# Patient Record
Sex: Male | Born: 1980 | Race: Black or African American | Hispanic: No | Marital: Married | Smoking: Never smoker
Health system: Southern US, Community
[De-identification: ages and names within clinical notes are randomized; demographics above are authoritative.]

---

## 2011-02-15 ENCOUNTER — Emergency Department (HOSPITAL_BASED_OUTPATIENT_CLINIC_OR_DEPARTMENT_OTHER)
Admission: EM | Admit: 2011-02-15 | Discharge: 2011-02-16 | Disposition: A | Discharge status: Home / Self Care | Payer: BC Managed Care – PPO | Attending: Emergency Medicine | Admitting: Emergency Medicine

## 2011-02-15 ENCOUNTER — Emergency Department (INDEPENDENT_AMBULATORY_CARE_PROVIDER_SITE_OTHER): Payer: BC Managed Care – PPO

## 2011-02-15 ENCOUNTER — Other Ambulatory Visit: Payer: Self-pay

## 2011-02-15 ENCOUNTER — Emergency Department (HOSPITAL_BASED_OUTPATIENT_CLINIC_OR_DEPARTMENT_OTHER): Payer: BC Managed Care – PPO

## 2011-02-15 DIAGNOSIS — R079 Chest pain, unspecified: Secondary | ICD-10-CM

## 2011-02-15 LAB — CBC
HCT: 39.8 % (ref 39.0–52.0)
Hemoglobin: 13.3 g/dL (ref 13.0–17.0)
MCHC: 33.4 g/dL (ref 30.0–36.0)
MCV: 88.2 fL (ref 78.0–100.0)
RDW: 12.4 % (ref 11.5–15.5)
WBC: 7.2 10*3/uL (ref 4.0–10.5)

## 2011-02-15 LAB — DIFFERENTIAL
Basophils Absolute: 0 10*3/uL (ref 0.0–0.1)
Eosinophils Relative: 1 % (ref 0–5)
Lymphocytes Relative: 30 % (ref 12–46)
Monocytes Absolute: 0.6 10*3/uL (ref 0.1–1.0)
Monocytes Relative: 9 % (ref 3–12)
Neutro Abs: 4.3 10*3/uL (ref 1.7–7.7)

## 2011-02-15 LAB — COMPREHENSIVE METABOLIC PANEL
BUN: 10 mg/dL (ref 6–23)
CO2: 28 mEq/L (ref 19–32)
Calcium: 10.1 mg/dL (ref 8.4–10.5)
Chloride: 106 mEq/L (ref 96–112)
Creatinine, Ser: 1 mg/dL (ref 0.50–1.35)
GFR calc Af Amer: >90 mL/min (ref >90)
GFR calc non Af Amer: >90 mL/min (ref >90)
Total Bilirubin: 0.3 mg/dL (ref 0.3–1.2)

## 2011-02-15 LAB — TROPONIN I: Troponin I: <0.3 ng/mL (ref <0.30)

## 2011-02-15 NOTE — ED Notes (Signed)
C/p CP,n,sob,numbness to arms-states cont'd c/o less intense

## 2011-02-16 NOTE — ED Provider Notes (Signed)
History     CSN: [id number] Arrival date & time: 02/15/2011 11:00 PM  Chief Complaint  Patient presents with  . Chest Pain     HPI Gerald Estrada is a 30 y.o.male who presents today complaining of chest pain.He reports that the pain associated with this is a 6 out of 10.  The pain is in the  middle of his chest.  It radiates to left arm.  It is made better by nothing in particular that he can think of no is feeling better at the moment. It is made worse by nothing.  Patient first noticed this 2 days ago. He cannot recall what he was doing at that time. Pain started on the right side of his chest. He denies any associated cough or fevers. Patient has no significant past medical history. He is no family history of early coronary artery disease. He did not have any variation in his pain with deep inspiration. He did not have any family history of DVT or PE. The patient came today as he was concerned that he could be having a heart attack. He said that his pain is currently only a 2/10. He has not taken anything for this and it has been constant but improving. There are no other associated or modifying factors.   History reviewed. No pertinent past medical history.  History reviewed. No pertinent past surgical history.  Family History  Problem Relation Age of Onset  . Diabetes Father     History  Substance Use Topics  . Smoking status: Never Smoker   . Smokeless tobacco: Not on file  . Alcohol Use: Yes      Review of Systems  Constitutional: Negative.   HENT: Negative.   Eyes: Negative.   Respiratory: Negative.   Cardiovascular: Positive for chest pain.  Gastrointestinal: Negative.   Genitourinary: Negative.   Musculoskeletal: Negative.   Skin: Negative.   Neurological: Negative.   Hematological: Negative.   Psychiatric/Behavioral: Negative.   All other systems reviewed and are negative.    Allergies  Review of patient's allergies indicates no known allergies.  Home  Medications  No current outpatient prescriptions on file.  BP 130/70  Pulse 79  Temp(Src) 98.8 F (37.1 C) (Oral)  Resp 16  Ht 6\' 2"  (1.88 m)  Wt 171 lb (77.565 kg)  BMI 21.96 kg/m2  Physical Exam  Nursing note and vitals reviewed. Constitutional: He is oriented to person, place, and time. He appears well-developed and well-nourished. No distress.  HENT:  Head: Normocephalic and atraumatic.  Eyes: Conjunctivae and EOM are normal. Pupils are equal, round, and reactive to light.  Neck: Normal range of motion.  Cardiovascular: Normal rate, regular rhythm, normal heart sounds and intact distal pulses.  Exam reveals no gallop and no friction rub.   No murmur heard. Pulmonary/Chest: Effort normal and breath sounds normal. No respiratory distress. He has no wheezes. He has no rales.  Abdominal: Soft. Bowel sounds are normal. He exhibits no distension. There is no tenderness. There is no rebound and no guarding.  Musculoskeletal: Normal range of motion.  Neurological: He is alert and oriented to person, place, and time. No cranial nerve deficit. He exhibits normal muscle tone. Coordination normal.  Skin: Skin is warm and dry. No rash noted.  Psychiatric: He has a normal mood and affect.    ED Course  Procedures    Date: 02/15/2011  Rate: 78  Rhythm: normal sinus rhythm  QRS Axis: normal  Intervals: normal  ST/T Wave  abnormalities: normal  Conduction Disutrbances:none  Narrative Interpretation:   Old EKG Reviewed: none available   Labs Reviewed  CBC  DIFFERENTIAL  COMPREHENSIVE METABOLIC PANEL  TROPONIN I   Dg Chest 2 View  02/15/2011  *RADIOLOGY REPORT*  Clinical Data: Chest pain  CHEST - 2 VIEW  Comparison: None  Findings: Heart size is normal.  Mediastinal shadows are normal. Lungs are clear.  No effusions.  No acute bony abnormality.  Pectus deformity.  IMPRESSION: Normal chest.  Pectus deformity.  Original Report Authenticated By: Thomasenia Sales, M.D.     1. Chest  pain, unspecified       MDM  Patient was evaluated by myself and was hemodynamically stable. His history was very atypical for possible acute coronary syndrome. He was also atypical for a possible pulmonary embolus or pneumonia. Patient had arrived prior to the beginning of my shift. Workup including a CBC, EKG, CBC, basic metabolic panel, and troponin have been ordered prior to my arrival. EKG was reviewed and was unremarkable. Remainder of laboratory workup was also within normal limits. Patient had complete resolution of his chest pain without intervention. We discussed reasons why the patient should return. He was also provided with reassurance. Patient was advised that as he did not have her primary care provider that he should consider obtaining one if this occurred again. Patient was discharged home in good condition.        Cyndra Numbers, MD 02/16/11 [phone]

## 2015-07-20 ENCOUNTER — Encounter (HOSPITAL_COMMUNITY): Payer: Self-pay | Admitting: Emergency Medicine

## 2015-07-20 ENCOUNTER — Emergency Department (HOSPITAL_COMMUNITY)
Admission: EM | Admit: 2015-07-20 | Discharge: 2015-07-20 | Disposition: A | Discharge status: Home / Self Care | Payer: No Typology Code available for payment source | Attending: Emergency Medicine | Admitting: Emergency Medicine

## 2015-07-20 ENCOUNTER — Emergency Department (HOSPITAL_COMMUNITY): Payer: No Typology Code available for payment source

## 2015-07-20 DIAGNOSIS — R52 Pain, unspecified: Secondary | ICD-10-CM

## 2015-07-20 DIAGNOSIS — Y92410 Unspecified street and highway as the place of occurrence of the external cause: Secondary | ICD-10-CM | POA: Diagnosis not present

## 2015-07-20 DIAGNOSIS — S29002A Unspecified injury of muscle and tendon of back wall of thorax, initial encounter: Secondary | ICD-10-CM | POA: Diagnosis not present

## 2015-07-20 DIAGNOSIS — Y998 Other external cause status: Secondary | ICD-10-CM | POA: Diagnosis not present

## 2015-07-20 DIAGNOSIS — Y9389 Activity, other specified: Secondary | ICD-10-CM | POA: Insufficient documentation

## 2015-07-20 DIAGNOSIS — S199XXA Unspecified injury of neck, initial encounter: Secondary | ICD-10-CM | POA: Diagnosis not present

## 2015-07-20 DIAGNOSIS — M542 Cervicalgia: Secondary | ICD-10-CM

## 2015-07-20 MED ORDER — IBUPROFEN 200 MG PO TABS
400.0000 mg | ORAL_TABLET | Freq: Once | ORAL | Status: AC
  Administered 2015-07-20: 400 mg via ORAL
  Filled 2015-07-20: qty 2

## 2015-07-20 MED ORDER — CYCLOBENZAPRINE HCL 10 MG PO TABS
10.0000 mg | ORAL_TABLET | Freq: Once | ORAL | Status: AC
  Administered 2015-07-20: 10 mg via ORAL
  Filled 2015-07-20: qty 1

## 2015-07-20 MED ORDER — CYCLOBENZAPRINE HCL 10 MG PO TABS: 10.0000 mg | ORAL_TABLET | Freq: Three times a day (TID) | ORAL | Status: DC | PRN

## 2015-07-20 MED ORDER — IBUPROFEN 400 MG PO TABS: 400.0000 mg | ORAL_TABLET | Freq: Three times a day (TID) | ORAL | Status: AC
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
## 2015-07-20 NOTE — ED Notes (Signed)
MD at bedside. 

## 2015-07-20 NOTE — ED Notes (Signed)
Patient transported to X-ray 

## 2015-07-22 ENCOUNTER — Encounter (HOSPITAL_COMMUNITY): Payer: Self-pay | Admitting: Emergency Medicine

## 2015-07-22 ENCOUNTER — Emergency Department (HOSPITAL_COMMUNITY)
Admission: EM | Admit: 2015-07-22 | Discharge: 2015-07-22 | Disposition: A | Discharge status: Home / Self Care | Payer: No Typology Code available for payment source | Attending: Emergency Medicine | Admitting: Emergency Medicine

## 2015-07-22 DIAGNOSIS — R51 Headache: Secondary | ICD-10-CM | POA: Diagnosis not present

## 2015-07-22 DIAGNOSIS — S134XXA Sprain of ligaments of cervical spine, initial encounter: Secondary | ICD-10-CM | POA: Diagnosis not present

## 2015-07-22 DIAGNOSIS — Y92410 Unspecified street and highway as the place of occurrence of the external cause: Secondary | ICD-10-CM | POA: Insufficient documentation

## 2015-07-22 DIAGNOSIS — S139XXA Sprain of joints and ligaments of unspecified parts of neck, initial encounter: Secondary | ICD-10-CM

## 2015-07-22 DIAGNOSIS — R519 Headache, unspecified: Secondary | ICD-10-CM

## 2015-07-22 DIAGNOSIS — Y9389 Activity, other specified: Secondary | ICD-10-CM | POA: Insufficient documentation

## 2015-07-22 DIAGNOSIS — Y998 Other external cause status: Secondary | ICD-10-CM | POA: Insufficient documentation

## 2015-07-22 DIAGNOSIS — S199XXA Unspecified injury of neck, initial encounter: Secondary | ICD-10-CM | POA: Diagnosis present

## 2015-07-22 NOTE — ED Notes (Signed)
Spoke with Lemont Fillersatyana, PA-C r/t work note.  Per Lemont Fillersatyana, may write note to restrict lifting >5# until Monday.
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
## 2018-01-28 ENCOUNTER — Encounter (HOSPITAL_COMMUNITY): Payer: Self-pay

## 2018-01-28 ENCOUNTER — Ambulatory Visit (HOSPITAL_COMMUNITY)
Admission: EM | Admit: 2018-01-28 | Discharge: 2018-01-28 | Disposition: A | Discharge status: Home / Self Care | Payer: 59 | Attending: Family Medicine | Admitting: Family Medicine

## 2018-01-28 ENCOUNTER — Ambulatory Visit (INDEPENDENT_AMBULATORY_CARE_PROVIDER_SITE_OTHER): Payer: 59

## 2018-01-28 DIAGNOSIS — S93401A Sprain of unspecified ligament of right ankle, initial encounter: Secondary | ICD-10-CM

## 2018-01-28 MED ORDER — IBUPROFEN 800 MG PO TABS: 800.0000 mg | ORAL_TABLET | Freq: Three times a day (TID) | ORAL | 0 refills | Status: DC

## 2018-01-28 NOTE — Discharge Instructions (Addendum)
Xray is normal here today, consistent with a sprain.  Ace wrap for compression and support, especially with activity.  Ice, elevation, ibuprofen for pain control.  Activity as tolerated.  See exercises provided. Please follow up with orthopedics if persistent pain greater than 4 weeks.

## 2018-01-28 NOTE — ED Provider Notes (Signed)
[facility]    CSN: [id number] Arrival date & time: 01/28/18  5621     History   Chief Complaint Chief Complaint  Patient presents with  . Ankle Pain    HPI Gerald Estrada is a 37 y.o. male.   Divon presents with complaints of right ankle pain after he miss-stepped off a step two nights ago and twisted it. Has not been able to walk since then as increased pain with weight bearing. Pain 7/10 with weight bearing. Has elevated it, no ice application. Has not taken any medications for his pain. States he sprained it once years ago but no other previous injury or surgery. No numbness or tingling. Without contributing medical history.     ROS per HPI.      History reviewed. No pertinent past medical history.  There are no active problems to display for this patient.   History reviewed. No pertinent surgical history.     Home Medications    Prior to Admission medications   Medication Sig Start Date End Date Taking? Authorizing Provider  cyclobenzaprine (FLEXERIL) 10 MG tablet Take 1 tablet (10 mg total) by mouth 3 (three) times daily as needed for muscle spasms. 07/20/15   Mesner, Barbara Cower, MD  ibuprofen (ADVIL,MOTRIN) 800 MG tablet Take 1 tablet (800 mg total) by mouth 3 (three) times daily. 01/28/18   Georgetta Haber, NP    Family History Family History  Problem Relation Age of Onset  . Diabetes Father   . Hypertension Other     Social History Social History   Tobacco Use  . Smoking status: Never Smoker  Substance Use Topics  . Alcohol use: No  . Drug use: No     Allergies   Patient has no known allergies.   Review of Systems Review of Systems   Physical Exam Triage Vital Signs ED Triage Vitals [01/28/18 0911]  Enc Vitals Group     BP 119/75     Pulse Rate 76     Resp 20     Temp 97.8 F (36.6 C)     Temp Source Oral     SpO2 99 %     Weight      Height      Head Circumference      Peak Flow      Pain Score      Pain Loc      Pain Edu?      Excl. in GC?    No data found.  Updated Vital Signs BP 119/75 (BP Location: Right Arm)   Pulse 76   Temp 97.8 F (36.6 C) (Oral)   Resp 20   SpO2 99%   Visual Acuity Right Eye Distance:   Left Eye Distance:   Bilateral Distance:    Right Eye Near:   Left Eye Near:    Bilateral Near:     Physical Exam  Constitutional: He is oriented to person, place, and time. He appears well-developed and well-nourished.  Cardiovascular: Normal rate and regular rhythm.  Pulmonary/Chest: Effort normal and breath sounds normal.  Musculoskeletal:       Right ankle: He exhibits decreased range of motion and swelling. He exhibits no ecchymosis, no deformity, no laceration and normal pulse. Tenderness. Lateral malleolus tenderness found. Achilles tendon normal.       Feet:  Right lateral ankle pain on palpation, mild dorsal ankle pain as well as mild medial ankle pain on palpation; pain with eversion and inversion noted as well  as some pain with dorsiflexion; toe ROm intact, cap refill < 2 seconds ; strong pedal pulse; swelling, primarily to lateral ankle; no redness   Neurological: He is alert and oriented to person, place, and time.  Skin: Skin is warm and dry.     UC Treatments / Results  Labs (all labs ordered are listed, but only abnormal results are displayed) Labs Reviewed - No data to display  EKG None  Radiology Dg Ankle Complete Right  Result Date: 01/28/2018 CLINICAL DATA:  Right ankle pain after injury 2 days ago. EXAM: RIGHT ANKLE - COMPLETE 3+ VIEW COMPARISON:  None. FINDINGS: There is no evidence of fracture, dislocation, or joint effusion. There is no evidence of arthropathy or other focal bone abnormality. Soft tissues are unremarkable. IMPRESSION: Normal right ankle. Electronically Signed   By: Lupita RaiderJames  Green Jr, M.D.   On: 01/28/2018 10:30    Procedures Procedures (including critical care time)  Medications Ordered in UC Medications - No data to  display  Initial Impression / Assessment and Plan / UC Course  I have reviewed the triage vital signs and the nursing notes.  Pertinent labs & imaging results that were available during my care of the patient were reviewed by me and considered in my medical decision making (see chart for details).     Negative xray today. History, physical and imaging consistent with sprain. Ice, elevation, ace wrap, crutches to help with pain. Activity as tolerated. Follow up with sports medicine as needed. Patient verbalized understanding and agreeable to plan.   Final Clinical Impressions(s) / UC Diagnoses   Final diagnoses:  Sprain of right ankle, unspecified ligament, initial encounter     Discharge Instructions     Xray is normal here today, consistent with a sprain.  Ace wrap for compression and support, especially with activity.  Ice, elevation, ibuprofen for pain control.  Activity as tolerated.  See exercises provided. Please follow up with orthopedics if persistent pain greater than 4 weeks.     ED Prescriptions    Medication Sig Dispense Auth. Provider   ibuprofen (ADVIL,MOTRIN) 800 MG tablet Take 1 tablet (800 mg total) by mouth 3 (three) times daily. 21 tablet Georgetta HaberBurky, Natalie B, NP     Controlled Substance Prescriptions [place] Controlled Substance Registry consulted? Not Applicable   Georgetta HaberBurky, Natalie B, NP 01/28/18 1041

## 2018-01-28 NOTE — ED Triage Notes (Signed)
Pt presents with right ankle pain after twisting it 2 nights ago while walking.

## 2018-01-30 ENCOUNTER — Ambulatory Visit (HOSPITAL_COMMUNITY)
Admission: EM | Admit: 2018-01-30 | Discharge: 2018-01-30 | Disposition: A | Discharge status: Home / Self Care | Payer: 59

## 2018-01-30 NOTE — ED Notes (Signed)
Pt requesting info for follow up for ankle sprain, given ortho urgent care hours and information. PT decided to go there tonight.

## 2018-02-06 ENCOUNTER — Ambulatory Visit: Payer: 59 | Admitting: Sports Medicine

## 2019-01-06 IMAGING — DX DG ANKLE COMPLETE 3+V*R*
3 series · 3 of 3 positions shown · non-contrast
Comparison: None.

CLINICAL DATA: Right ankle pain after injury 2 days ago.

EXAM:
RIGHT ANKLE - COMPLETE 3+ VIEW

[ankle ap]
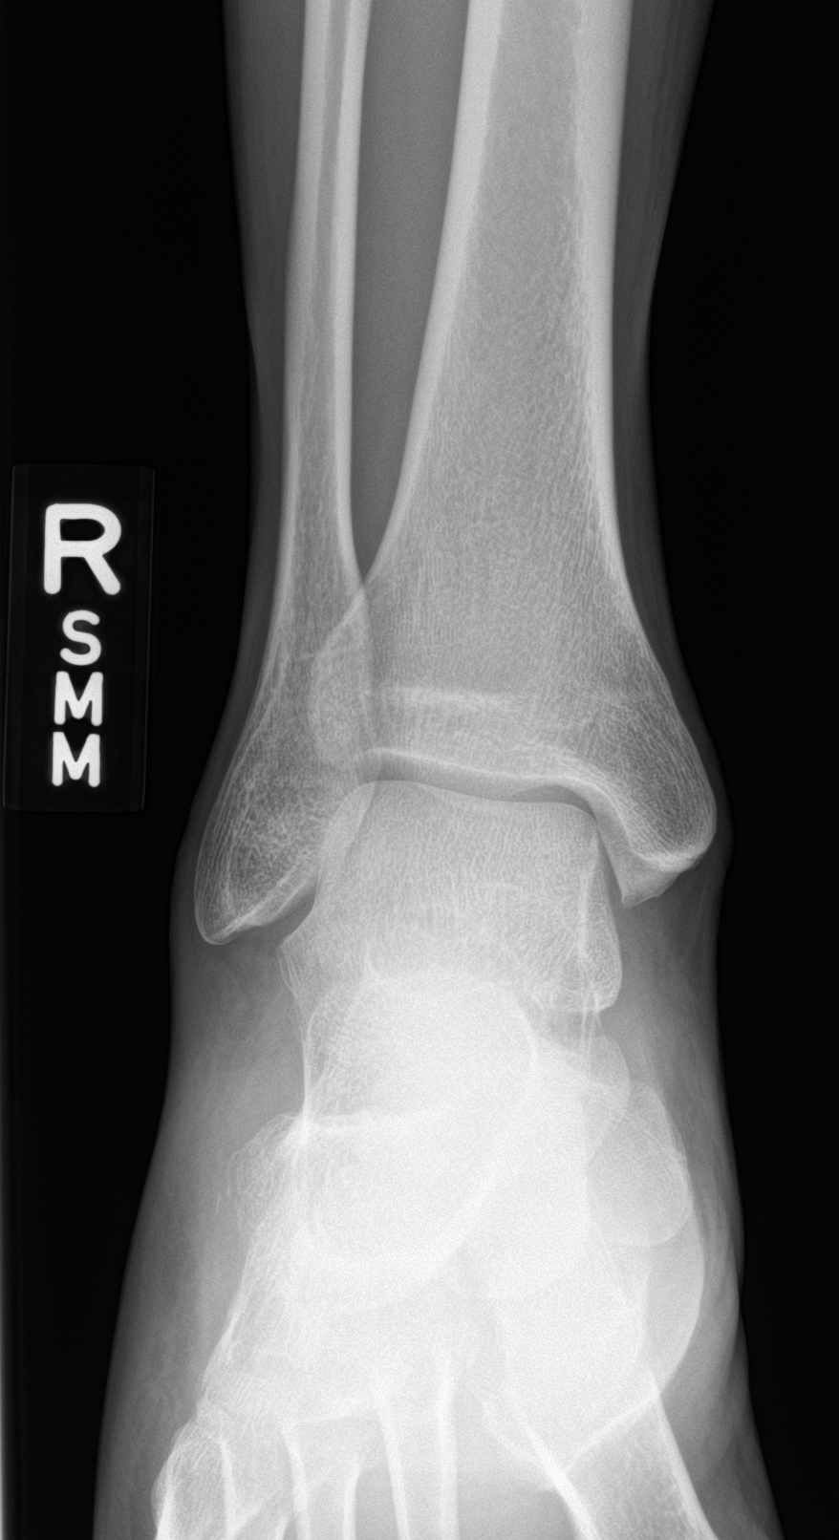

[ankle obl]
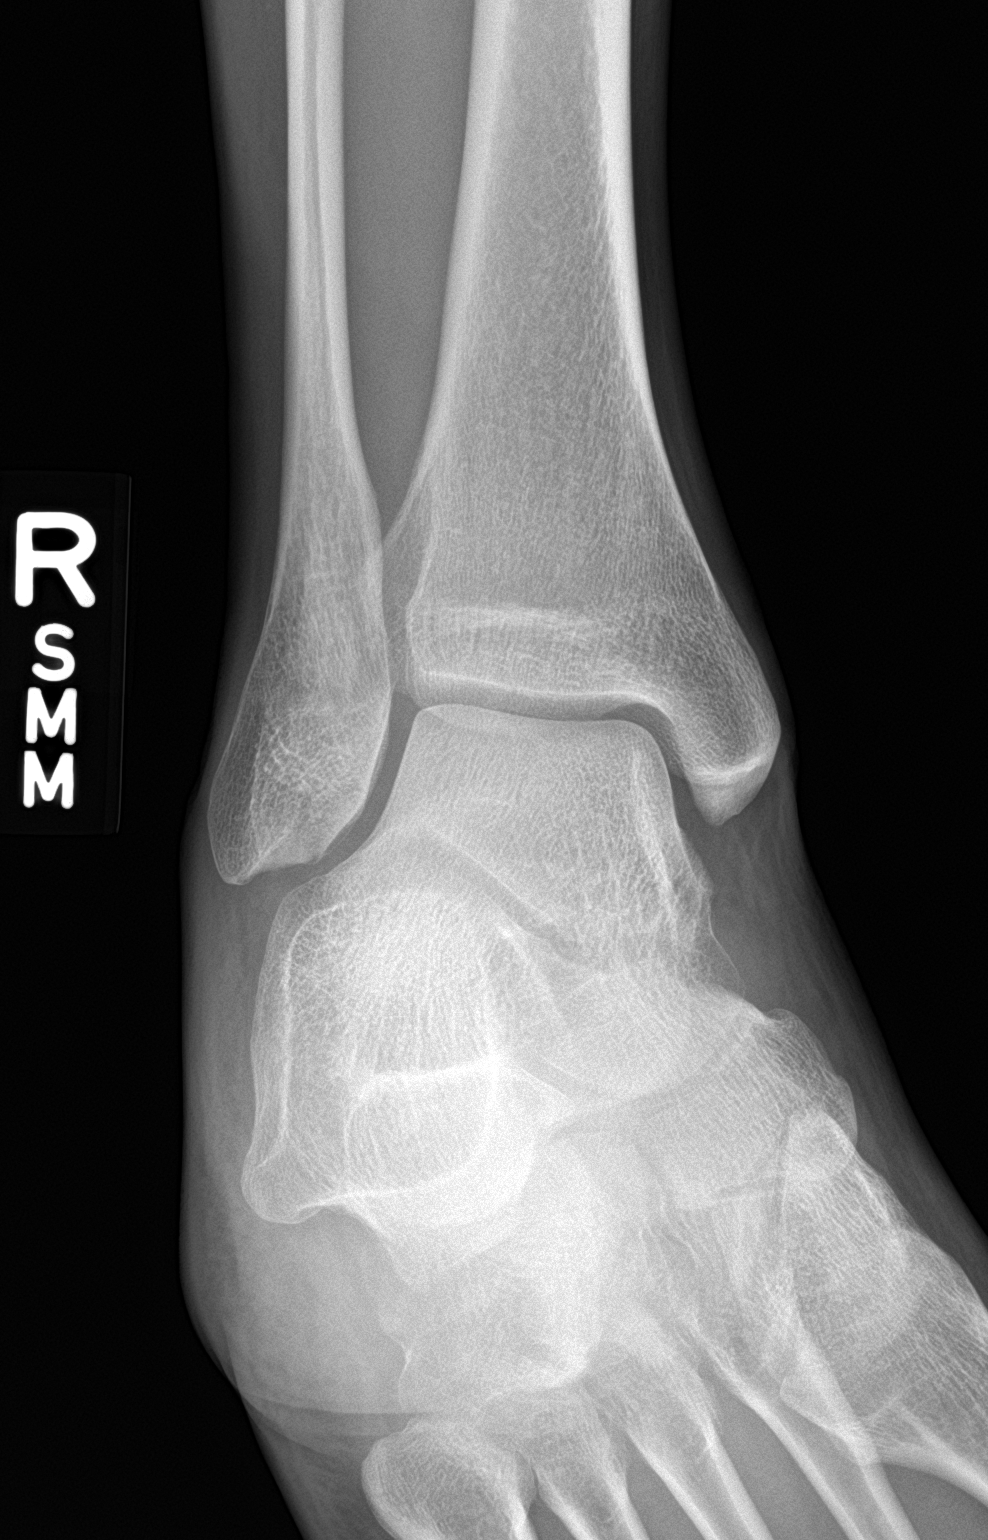

[ankle lat]
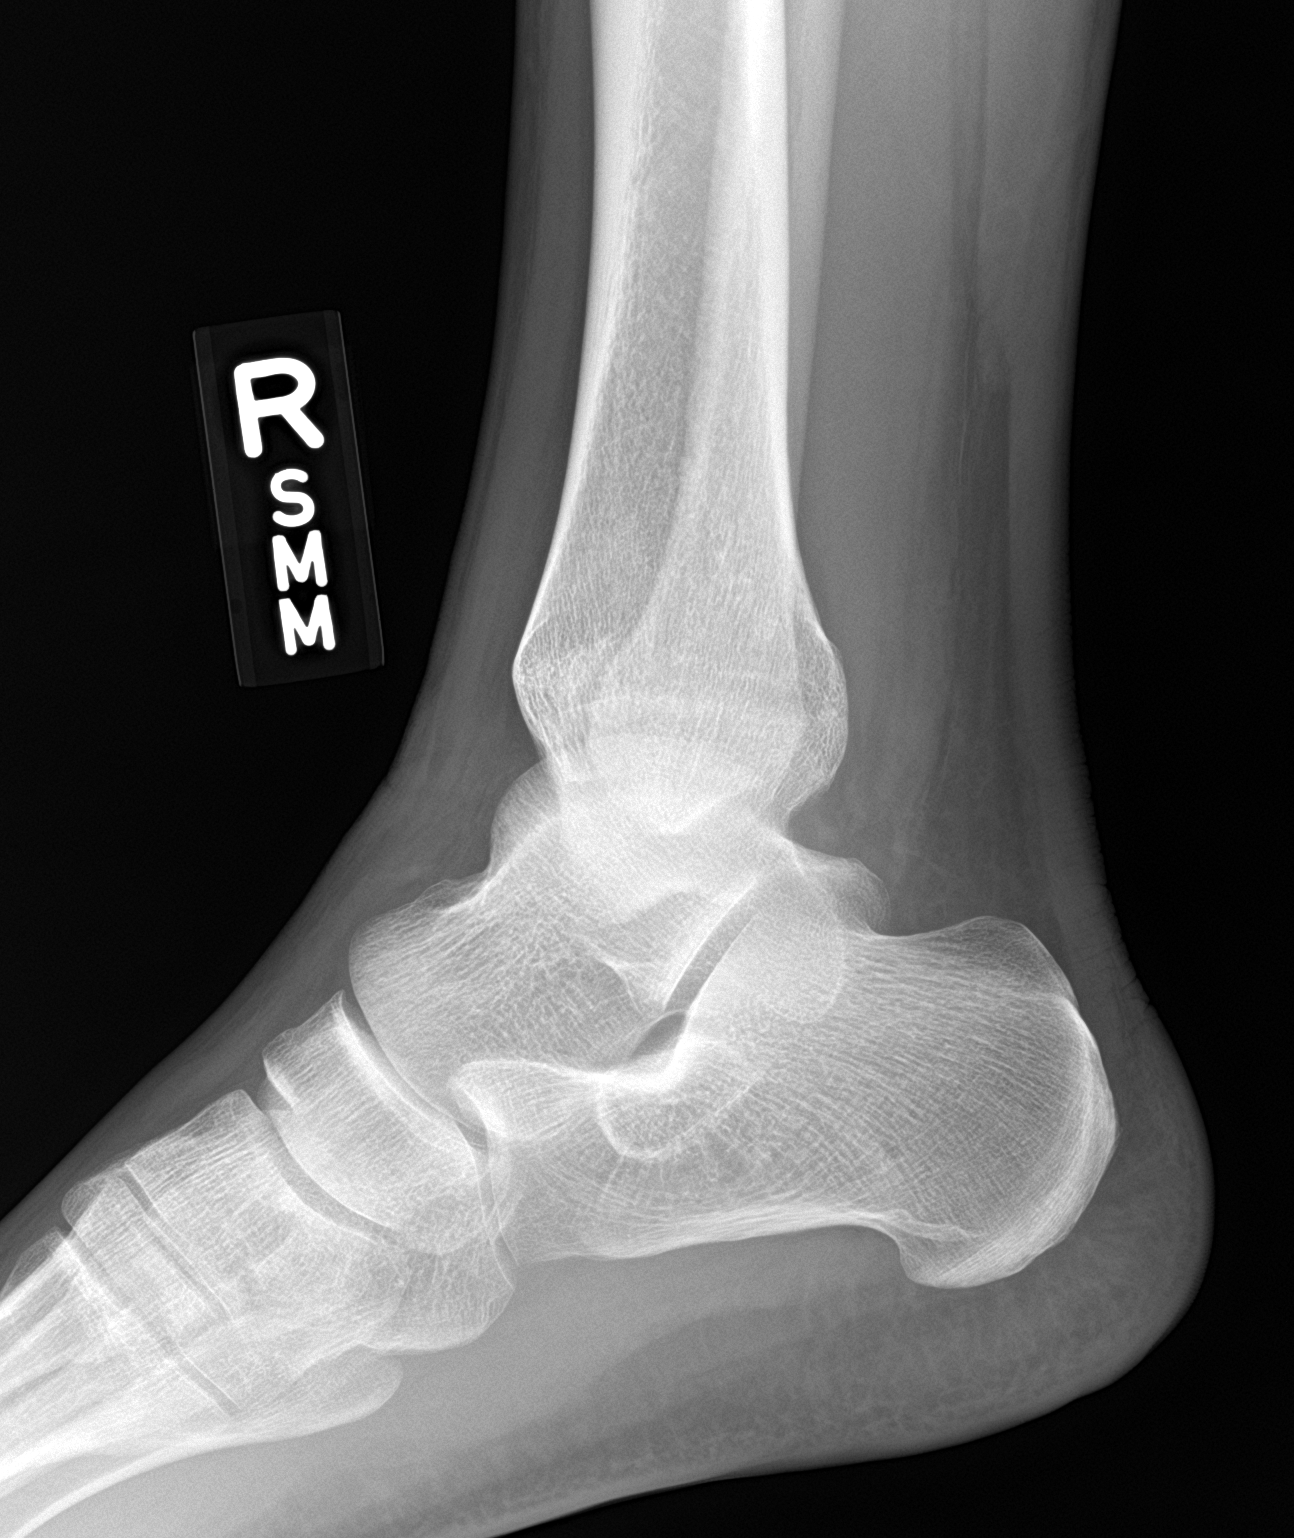

[3 of 3 positions shown; findings below may reference images not displayed]

FINDINGS: There is no evidence of fracture, dislocation, or joint effusion.
There is no evidence of arthropathy or other focal bone abnormality.
Soft tissues are unremarkable.
IMPRESSION: Normal right ankle.

## 2019-07-10 ENCOUNTER — Ambulatory Visit: Payer: Managed Care, Other (non HMO) | Attending: Internal Medicine

## 2019-07-10 DIAGNOSIS — U071 COVID-19: Secondary | ICD-10-CM | POA: Insufficient documentation

## 2019-07-10 DIAGNOSIS — Z20822 Contact with and (suspected) exposure to covid-19: Secondary | ICD-10-CM

## 2019-07-11 LAB — NOVEL CORONAVIRUS, NAA: SARS-CoV-2, NAA: DETECTED — AB

## 2019-07-22 ENCOUNTER — Encounter: Payer: Self-pay | Admitting: General Practice

## 2019-07-24 ENCOUNTER — Encounter: Payer: Self-pay | Admitting: Nurse Practitioner

## 2019-07-24 NOTE — Progress Notes (Signed)
Created in error

## 2020-01-29 ENCOUNTER — Ambulatory Visit (INDEPENDENT_AMBULATORY_CARE_PROVIDER_SITE_OTHER): Payer: Managed Care, Other (non HMO)

## 2020-01-29 ENCOUNTER — Ambulatory Visit
Admission: RE | Admit: 2020-01-29 | Discharge: 2020-01-29 | Disposition: A | Discharge status: Home / Self Care | Payer: Managed Care, Other (non HMO) | Source: Ambulatory Visit | Attending: Emergency Medicine | Admitting: Emergency Medicine

## 2020-01-29 ENCOUNTER — Other Ambulatory Visit: Payer: Self-pay

## 2020-01-29 VITALS — BP 121/77 | HR 88 | Temp 98.4°F | Resp 18

## 2020-01-29 DIAGNOSIS — R109 Unspecified abdominal pain: Secondary | ICD-10-CM

## 2020-01-29 LAB — POCT URINALYSIS DIP (MANUAL ENTRY)
Bilirubin, UA: NEGATIVE
Glucose, UA: NEGATIVE mg/dL
Ketones, POC UA: NEGATIVE mg/dL
Leukocytes, UA: NEGATIVE
Nitrite, UA: NEGATIVE
Protein Ur, POC: NEGATIVE mg/dL
Spec Grav, UA: 1.015 (ref 1.010–1.025)
Urobilinogen, UA: 0.2 E.U./dL
pH, UA: 6 (ref 5.0–8.0)

## 2020-01-29 MED ORDER — NAPROXEN 500 MG PO TABS: 500.0000 mg | ORAL_TABLET | Freq: Two times a day (BID) | ORAL | 0 refills | Status: DC

## 2020-01-29 NOTE — Discharge Instructions (Signed)
X-ray normal, blood work pending Naprosyn twice daily as needed for pain Continue to drink plenty of fluids, read attached on dizziness Follow-up if any symptoms not improving or worsening

## 2020-01-29 NOTE — ED Provider Notes (Signed)
[facility]    CSN: [id number] Arrival date & time: 01/29/20  1603      History   Chief Complaint Chief Complaint  Patient presents with  . Flank Pain    Left side since sunday    HPI Gerald Estrada is a 39 y.o. male presenting today for evaluation of left lower back pain and dizziness.  Patient reports that on Sunday he began to develop pain in his left lower back.  Denies any injury fall or trauma.  Pain has been constant.  Denies worsening with movement or certain positions.  Denies any urinary symptoms of hematuria, dysuria, frequency or urgency.  Denies prior kidney stone.  He also reports over the past 24 hours he has developed increased dizziness/lightheadedness.  Denies spinning or off-balance sensation.  He has been trying to drink more water without relief of symptoms.  Denies abdominal pain nausea or vomiting.  Eating and drinking relatively normally without change in pain.  Bowel movements normal for him, typically goes every 2 days.  Denies any recent worsening or increased constipation.  Has been taking zinc D3 and vitamin C which has helped with his overall sense of wellbeing, but symptoms returned after medicines wear off.  He denies any tobacco use.  Has had a slight burning sensation in his chest and feeling as if there is phlegm.  He feels as if he is about to develop a cold.  Expresses concern over possible pneumonia.    HPI  History reviewed. No pertinent past medical history.  There are no problems to display for this patient.   History reviewed. No pertinent surgical history.     Home Medications    Prior to Admission medications   Medication Sig Start Date End Date Taking? Authorizing Provider  naproxen (NAPROSYN) 500 MG tablet Take 1 tablet (500 mg total) by mouth 2 (two) times daily. 01/29/20   Shekia Kuper, Junius Creamer, PA-C    Family History Family History  Problem Relation Age of Onset  . Diabetes Father   . Hypertension Other      Social History Social History   Tobacco Use  . Smoking status: Never Smoker  . Smokeless tobacco: Never Used  Vaping Use  . Vaping Use: Never used  Substance Use Topics  . Alcohol use: No  . Drug use: No     Allergies   Patient has no known allergies.   Review of Systems Review of Systems  Constitutional: Negative for fatigue and fever.  Eyes: Negative for redness, itching and visual disturbance.  Respiratory: Negative for shortness of breath.   Cardiovascular: Negative for chest pain and leg swelling.  Gastrointestinal: Negative for abdominal pain, constipation, diarrhea, nausea and vomiting.  Genitourinary: Negative for decreased urine volume, difficulty urinating, frequency, hematuria and urgency.  Musculoskeletal: Positive for back pain and myalgias. Negative for arthralgias.  Skin: Negative for color change, rash and wound.  Neurological: Positive for dizziness and light-headedness. Negative for syncope, weakness and headaches.     Physical Exam Triage Vital Signs ED Triage Vitals  Enc Vitals Group     BP 01/29/20 1613 121/77     Pulse Rate 01/29/20 1613 88     Resp 01/29/20 1613 18     Temp 01/29/20 1613 98.4 F (36.9 C)     Temp Source 01/29/20 1613 Oral     SpO2 01/29/20 1613 96 %     Weight --      Height --      Head Circumference --  Peak Flow --      Pain Score 01/29/20 1615 2     Pain Loc --      Pain Edu? --      Excl. in GC? --    No data found.  Updated Vital Signs BP 121/77 (BP Location: Right Arm)   Pulse 88   Temp 98.4 F (36.9 C) (Oral)   Resp 18   SpO2 96%   Visual Acuity Right Eye Distance:   Left Eye Distance:   Bilateral Distance:    Right Eye Near:   Left Eye Near:    Bilateral Near:     Physical Exam Vitals and nursing note reviewed.  Constitutional:      Appearance: He is well-developed.     Comments: No acute distress  HENT:     Head: Normocephalic and atraumatic.     Ears:     Comments: Bilateral  ears without tenderness to palpation of external auricle, tragus and mastoid, EAC's without erythema or swelling, TM's with good bony landmarks and cone of light. Non erythematous.     Nose: Nose normal.     Mouth/Throat:     Comments: Oral mucosa pink and moist, no tonsillar enlargement or exudate. Posterior pharynx patent and nonerythematous, no uvula deviation or swelling. Normal phonation. Eyes:     Conjunctiva/sclera: Conjunctivae normal.  Cardiovascular:     Rate and Rhythm: Normal rate.  Pulmonary:     Effort: Pulmonary effort is normal. No respiratory distress.     Comments: Breathing comfortably at rest, CTABL, no wheezing, rales or other adventitious sounds auscultated Abdominal:     General: There is no distension.     Comments: Soft, nondistended, nontender to palpation throughout abdomen  Musculoskeletal:        General: Normal range of motion.     Cervical back: Neck supple.     Comments: Nontender to palpation of cervical, thoracic and lumbar spine midline, mild reproducible tenderness to far lateral lumbar musculature to upper lumbar area  Skin:    General: Skin is warm and dry.  Neurological:     Mental Status: He is alert and oriented to person, place, and time.      UC Treatments / Results  Labs (all labs ordered are listed, but only abnormal results are displayed) Labs Reviewed  BASIC METABOLIC PANEL - Abnormal; Notable for the following components:      Result Value   Calcium 10.4 (*)    All other components within normal limits   Narrative:    Performed at:  [address], [place], [place]  [id number] Lab Director: Jolene Schimke MD, Phone:  [phone]  POCT URINALYSIS DIP (MANUAL ENTRY) - Abnormal; Notable for the following components:   Blood, UA trace-intact (*)    All other components within normal limits  CBC   Narrative:    Performed at:  [address] [place], [place]  [id number] Lab Director:  Jolene Schimke MD, Phone:  [phone]    EKG   Radiology DG Abd Acute W/Chest  Result Date: 01/29/2020 CLINICAL DATA:  Pain.  Left flank pain. EXAM: ACUTE ABDOMEN SERIES (2 VIEW ABDOMEN AND 1 VIEW CHEST) COMPARISON:  None. FINDINGS: The lungs are clear. The bowel gas pattern is nonobstructive. There are no definite radiopaque kidney stones. There is a calcification in the patient's right hemipelvis favored to represent a phlebolith. IMPRESSION: 1. No definite radiopaque kidney stones. 2. Nonobstructive bowel gas pattern. 3. No  acute cardiopulmonary disease. Electronically Signed   By: Katherine Mantle M.D.   On: 01/29/2020 17:44    Procedures Procedures (including critical care time)  Medications Ordered in UC Medications - No data to display  Initial Impression / Assessment and Plan / UC Course  I have reviewed the triage vital signs and the nursing notes.  Pertinent labs & imaging results that were available during my care of the patient were reviewed by me and considered in my medical decision making (see chart for details).     Left lumbar back pain-unclear etiology at this time, does not fit classic MSK etiology is not triggered with movement and minimally reproducible, trace hemoglobin on UA, but without urinary symptoms, cannot rule out underlying stone at this time.  Acute abdomen and chest without abnormality.  Bowels have been regular.  Checking basic labs given patient's associated lightheadedness and feeling under the weather, will call if abnormal.  CBC in BMP reassuring.  Recommending symptomatic and supportive care, Naprosyn as needed for pain.  Discussed strict return precautions. Patient verbalized understanding and is agreeable with plan.  Final Clinical Impressions(s) / UC Diagnoses   Final diagnoses:  Left flank pain     Discharge Instructions     X-ray normal, blood work pending Naprosyn twice daily as needed for pain Continue to drink plenty of fluids,  read attached on dizziness Follow-up if any symptoms not improving or worsening    ED Prescriptions    Medication Sig Dispense Auth. Provider   naproxen (NAPROSYN) 500 MG tablet Take 1 tablet (500 mg total) by mouth 2 (two) times daily. 30 tablet Ricarda Atayde, [place] C, PA-C     PDMP not reviewed this encounter.   Lew Dawes, [place] 01/30/20 1741

## 2020-01-29 NOTE — ED Triage Notes (Signed)
Pt states he has had flank pain since Sunday and began experiencing dizziness and light headedness yesterday. Pt also complains of low temperature. Pt is aox4 and ambulatory.

## 2020-01-30 LAB — CBC
Hematocrit: 45.7 % (ref 37.5–51.0)
Hemoglobin: 14.9 g/dL (ref 13.0–17.7)
MCH: 28.8 pg (ref 26.6–33.0)
MCHC: 32.6 g/dL (ref 31.5–35.7)
MCV: 88 fL (ref 79–97)
Platelets: 370 10*3/uL (ref 150–450)
RBC: 5.17 x10E6/uL (ref 4.14–5.80)
RDW: 12.4 % (ref 11.6–15.4)
WBC: 8.4 10*3/uL (ref 3.4–10.8)

## 2020-01-30 LAB — BASIC METABOLIC PANEL
BUN/Creatinine Ratio: 18 (ref 9–20)
BUN: 20 mg/dL (ref 6–24)
CO2: 25 mmol/L (ref 20–29)
Calcium: 10.4 mg/dL — ABNORMAL HIGH (ref 8.7–10.2)
Chloride: 104 mmol/L (ref 96–106)
Creatinine, Ser: 1.14 mg/dL (ref 0.76–1.27)
GFR calc Af Amer: 92 mL/min/{1.73_m2} (ref >59)
GFR calc non Af Amer: 80 mL/min/{1.73_m2} (ref >59)
Glucose: 90 mg/dL (ref 65–99)
Potassium: 4.5 mmol/L (ref 3.5–5.2)
Sodium: 143 mmol/L (ref 134–144)

## 2020-06-02 ENCOUNTER — Other Ambulatory Visit: Payer: Self-pay

## 2020-06-02 ENCOUNTER — Ambulatory Visit
Admission: EM | Admit: 2020-06-02 | Discharge: 2020-06-02 | Disposition: A | Discharge status: Home / Self Care | Payer: Managed Care, Other (non HMO) | Attending: Urgent Care | Admitting: Urgent Care

## 2020-06-02 DIAGNOSIS — I498 Other specified cardiac arrhythmias: Secondary | ICD-10-CM

## 2020-06-02 DIAGNOSIS — R002 Palpitations: Secondary | ICD-10-CM

## 2020-06-02 NOTE — ED Provider Notes (Signed)
  [facility]   MRN: [id number] DOB: [birth year]  Subjective:   Gerald Estrada is a 40 y.o. male presenting for 2 week history of persistent intermittent right sided heart fluttering. Denies fever, chest pain, shob, diaphoresis, n/v, abd pain. No smoking, occasional alcohol use. No history of heart conditions. No family hx of arrhythmias. No drug use.   No current facility-administered medications for this encounter.  Current Outpatient Medications:  .  naproxen (NAPROSYN) 500 MG tablet, Take 1 tablet (500 mg total) by mouth 2 (two) times daily., Disp: 30 tablet, Rfl: 0   No Known Allergies  History reviewed. No pertinent past medical history.   History reviewed. No pertinent surgical history.  Family History  Problem Relation Age of Onset  . Diabetes Father   . Hypertension Other   . Hypertension Mother     Social History   Tobacco Use  . Smoking status: Never Smoker  . Smokeless tobacco: Never Used  Vaping Use  . Vaping Use: Never used  Substance Use Topics  . Alcohol use: Yes  . Drug use: No    ROS   Objective:   Vitals: BP 104/70 (BP Location: Left Arm)   Pulse 75   Temp 97.9 F (36.6 C) (Oral)   Resp 17   SpO2 97%   Physical Exam Constitutional:      General: He is not in acute distress.    Appearance: Normal appearance. He is well-developed. He is not ill-appearing, toxic-appearing or diaphoretic.  HENT:     Head: Normocephalic and atraumatic.     Right Ear: External ear normal.     Left Ear: External ear normal.     Nose: Nose normal.     Mouth/Throat:     Mouth: Mucous membranes are moist.     Pharynx: Oropharynx is clear.  Eyes:     General: No scleral icterus.    Extraocular Movements: Extraocular movements intact.     Pupils: Pupils are equal, round, and reactive to light.  Cardiovascular:     Rate and Rhythm: Normal rate and regular rhythm.     Heart sounds: Normal heart sounds. No murmur heard. No friction rub. No gallop.    Pulmonary:     Effort: Pulmonary effort is normal. No respiratory distress.     Breath sounds: Normal breath sounds. No stridor. No wheezing, rhonchi or rales.  Neurological:     Mental Status: He is alert and oriented to person, place, and time.  Psychiatric:        Mood and Affect: Mood normal.        Behavior: Behavior normal.        Thought Content: Thought content normal.     ED ECG REPORT   Date: 06/02/2020  EKG Time: 9:23 AM  Rate: 70bpm  Rhythm: normal sinus rhythm,  normal EKG, normal sinus rhythm  Axis: normal  Intervals:none  ST&T Change: none  Narrative Interpretation: NSR at 70bpm, no ekg for comparison.   Assessment and Plan :   PDMP not reviewed this encounter.  1. Fluttering heart   2. Palpitations    Referral to cardiology pending for consideration of Holter monitor.     Wallis Bamberg, [place] 06/02/20 [phone]

## 2020-06-02 NOTE — ED Triage Notes (Signed)
Pt is here with heart flutters that started 2 weeks ago, pt has not taken any to relieve discomfort.

## 2020-06-15 ENCOUNTER — Ambulatory Visit: Payer: Managed Care, Other (non HMO) | Admitting: Internal Medicine

## 2020-06-15 NOTE — Progress Notes (Deleted)
Cardiology Office Note:    Date:  06/15/2020   ID:  Gerald Estrada, DOB [birth year], MRN [id number]  PCP:  Patient, No Pcp Per  [facility] Cardiologist:  No primary care provider on file.  [facility] Electrophysiologist:  None   CC: *** Consulted for the evaluation of heart fluttering at the behest of Mr. Gerald Bamberg PA-C   History of Present Illness:    Gerald Estrada is a 40 y.o. male with a hx of palpitations who presents for evaluation 06/15/20.  Patient notes that he is feeling ***.    Had ED evaluation 06/02/20 for heart fluttering; was in NSR with relatively benign work up.  Asked to evaluation with cardiologist for more complete work up.  Has had no chest pain, chest pressure, chest tightness, chest stinging ***.  Discomfort occurs with ***, worsens with ***, and improves with ***.  Patient exertion notable for *** with *** and feels no symptoms.  No shortness of breath, DOE ***.  No PND or orthopnea***.  No bendopnea***, weight gain***, leg swelling ***, or abdominal swelling***.  No syncope or near syncope ***. Notes *** no palpitations or funny heart beats.     Patient reports prior cardiac testing including *** echo, *** stress test, *** heart catheterizations, *** cardioversion, *** ablations.  No history of ***pre-eclampsia, early menarche, or prematurity.  No Fen-Phen or drug use***.  Ambulatory BP ***.   No past medical history on file.  No past surgical history on file.  Current Medications: No outpatient medications have been marked as taking for the 06/15/20 encounter (Appointment) with Christell Constant, MD.     Allergies:   Patient has no known allergies.   Social History   Socioeconomic History  . Marital status: Single    Spouse name: Not on file  . Number of children: Not on file  . Years of education: Not on file  . Highest education level: Not on file  Occupational History  . Not on file  Tobacco Use  . Smoking status: Never Smoker   . Smokeless tobacco: Never Used  Vaping Use  . Vaping Use: Never used  Substance and Sexual Activity  . Alcohol use: Yes  . Drug use: No  . Sexual activity: Yes    Birth control/protection: None  Other Topics Concern  . Not on file  Social History Narrative  . Not on file   Social Determinants of Health   Financial Resource Strain: Not on file  Food Insecurity: Not on file  Transportation Needs: Not on file  Physical Activity: Not on file  Stress: Not on file  Social Connections: Not on file     Family History: The patient's family history includes Diabetes in his father; Hypertension in his mother and another family member. History of coronary artery disease notable for ***. History of heart failure notable for ***. No history of cardiomyopathies including hypertrophic cardiomyopathy, left ventricular non-compaction, or arrhythmogenic right ventricular cardiomyopathy.*** History of arrhythmia notable for ***. Denies family history of sudden cardiac death including drowning, car accidents, or unexplained deaths in the family.*** No history of bicuspid aortic valve or aortic aneurysm or dissection.  ROS:   Please see the history of present illness.    *** All other systems reviewed and are negative.  EKGs/Labs/Other Studies Reviewed:    The following studies were reviewed today:  EKG:   06/02/20: ***  Recent Labs: 01/29/2020: BUN 20; Creatinine, Ser 1.14; Hemoglobin 14.9; Platelets 370; Potassium 4.5; Sodium 143  Recent Lipid Panel No results found for: CHOL, TRIG, HDL, CHOLHDL, VLDL, LDLCALC, LDLDIRECT   Risk Assessment/Calculations:   {Does this patient have ATRIAL FIBRILLATION?[phone]}   Physical Exam:    VS:  There were no vitals taken for this visit.    Wt Readings from Last 3 Encounters:  02/15/11 171 lb (77.6 kg)     GEN: *** Well nourished, well developed in no acute distress HEENT: Normal NECK: No JVD; No carotid bruits LYMPHATICS: No  lymphadenopathy CARDIAC: ***RRR, no murmurs, rubs, gallops RESPIRATORY:  Clear to auscultation without rales, wheezing or rhonchi  ABDOMEN: Soft, non-tender, non-distended MUSCULOSKELETAL:  No edema; No deformity  SKIN: Warm and dry NEUROLOGIC:  Alert and oriented x 3 PSYCHIATRIC:  Normal affect   ASSESSMENT:    No diagnosis found. PLAN:    In order of problems listed above:  1. ***   {Are you ordering a CV Procedure (e.g. stress test, cath, DCCV, TEE, etc)?   Press F2        :[id number]}    Medication Adjustments/Labs and Tests Ordered: Current medicines are reviewed at length with the patient today.  Concerns regarding medicines are outlined above.  No orders of the defined types were placed in this encounter.  No orders of the defined types were placed in this encounter.   There are no Patient Instructions on file for this visit.   Signed, Christell Constant, MD  06/15/2020 9:33 AM    [facility]

## 2020-06-16 NOTE — Progress Notes (Signed)
Cardiology Office Note:   Date:  06/17/2020  NAME:  Gerald Estrada    MRN: [id number] DOB:  01-Jan-1981   PCP:  Patient, No Pcp Per  Cardiologist:  No primary care provider on file.   Referring MD: Wallis Bamberg, PA-C   Chief Complaint  Patient presents with  . Palpitations        History of Present Illness:   Gerald Estrada is a 40 y.o. male without history who is being seen today for the evaluation of palpitations at the request of Wallis Bamberg, [place].  He was seen in urgent care on 06/03/2019.  Had episodes of fluttering in his chest.  They occur periodically for roughly 1 week prior to that visit.  Symptoms lasted seconds.  Would occur several times per day.  No identifiable trigger.  He reports his heart would just be fast and then slow down.  No alleviating factors of symptoms happened very quickly and were off before he can try anything.  He reports no significant stress.  He rarely drinks caffeine.  He drinks plenty of water.  He reports no chest pain or shortness of breath with the symptoms.  His symptoms have all resolved.  He does not smoke.  He drinks alcohol moderation.  No drug use reported.  He has no medical problems.  His blood pressure is 108/66.  EKG demonstrates normal sinus rhythm with no acute ischemic changes or evidence of infarction.  No strong family history of heart disease.  Normal cardiovascular examination.  He does have an apple watch.  He did record some EKG tracings.  They were normal.  He said no further episodes.  No recent TSH.  He does need labs.  Past Medical History: History reviewed. No pertinent past medical history.  Past Surgical History: History reviewed. No pertinent surgical history.  Current Medications: Current Meds  Medication Sig  . [DISCONTINUED] naproxen (NAPROSYN) 500 MG tablet Take 1 tablet (500 mg total) by mouth 2 (two) times daily.     Allergies:    Patient has no known allergies.   Social History: Social History   Socioeconomic  History  . Marital status: Married    Spouse name: Not on file  . Number of children: 3  . Years of education: Not on file  . Highest education level: Not on file  Occupational History  . Occupation: Production designer, theatre/television/film   Tobacco Use  . Smoking status: Never Smoker  . Smokeless tobacco: Never Used  Vaping Use  . Vaping Use: Never used  Substance and Sexual Activity  . Alcohol use: Yes  . Drug use: No  . Sexual activity: Yes    Birth control/protection: None  Other Topics Concern  . Not on file  Social History Narrative  . Not on file   Social Determinants of Health   Financial Resource Strain: Not on file  Food Insecurity: Not on file  Transportation Needs: Not on file  Physical Activity: Not on file  Stress: Not on file  Social Connections: Not on file     Family History: The patient's family history includes Diabetes in his father; Hypertension in his mother and another family member.  ROS:   All other ROS reviewed and negative. Pertinent positives noted in the HPI.     EKGs/Labs/Other Studies Reviewed:   The following studies were personally reviewed by me today:  EKG:  EKG is ordered today.  The ekg ordered today demonstrates normal sinus rhythm heart rate 71, LVH by voltage, and was  personally reviewed by me.   Recent Labs: 01/29/2020: BUN 20; Creatinine, Ser 1.14; Hemoglobin 14.9; Platelets 370; Potassium 4.5; Sodium 143   Recent Lipid Panel No results found for: CHOL, TRIG, HDL, CHOLHDL, VLDL, LDLCALC, LDLDIRECT  Physical Exam:   VS:  BP 108/66   Pulse 71   Ht 6\' 2"  (1.88 m)   Wt 192 lb (87.1 kg)   SpO2 100%   BMI 24.65 kg/m    Wt Readings from Last 3 Encounters:  06/17/20 192 lb (87.1 kg)  02/15/11 171 lb (77.6 kg)    General: Well nourished, well developed, in no acute distress Head: Atraumatic, normal size  Eyes: PEERLA, EOMI  Neck: Supple, no JVD Endocrine: No thryomegaly Cardiac: Normal S1, S2; RRR; no murmurs, rubs, or gallops Lungs: Clear to  auscultation bilaterally, no wheezing, rhonchi or rales  Abd: Soft, nontender, no hepatomegaly  Ext: No edema, pulses 2+ Musculoskeletal: No deformities, BUE and BLE strength normal and equal Skin: Warm and dry, no rashes   Neuro: Alert and oriented to person, place, time, and situation, CNII-XII grossly intact, no focal deficits  Psych: Normal mood and affect   ASSESSMENT:   Gerald Estrada is a 40 y.o. male who presents for the following: 1. Palpitations     PLAN:   1. Palpitations -1 week of palpitations roughly 2 to 3 weeks ago.  Symptoms have resolved.  EKG in office demonstrates sinus rhythm with no acute ischemic changes or evidence of infarction.  This is a normal EKG.  His cardiovascular examination is normal.  I recommended a CBC, BMP and TSH.  He is quite healthy without any limitations.  His cardiovascular examination is normal.  He does not need an echocardiogram.  Moving forward I recommended he monitor his heart rhythm on the apple watch.  He has EKG capabilities.  If his symptoms return he should capture some tracings and then call 41 for review.  We will hold on a monitor from our office as his symptoms have resolved.  He will let us know if his symptoms return.  Disposition: Return if symptoms worsen or fail to improve.  Medication Adjustments/Labs and Tests Ordered: Current medicines are reviewed at length with the patient today.  Concerns regarding medicines are outlined above.  Orders Placed This Encounter  Procedures  . Basic metabolic panel  . TSH  . CBC  . EKG 12-Lead   No orders of the defined types were placed in this encounter.   Patient Instructions  Medication Instructions:  The current medical regimen is effective;  continue present plan and medications.  *If you need a refill on your cardiac medications before your next appointment, please call your pharmacy*   Lab Work: BMET, TSH, CBC  If you have labs (blood work) drawn today and your tests are  completely normal, you will receive your results only by: [place] MyChart Message (if you have MyChart) OR . A paper copy in the mail If you have any lab test that is abnormal or we need to change your treatment, we will call you to review the results.   Follow-Up: At [facility], you and your health needs are our priority.  As part of our continuing mission to provide you with exceptional heart care, we have created designated Provider Care Teams.  These Care Teams include your primary Cardiologist (physician) and Advanced Practice Providers (APPs -  Physician Assistants and Nurse Practitioners) who all work together to provide you with the care you need, when you  need it.  We recommend signing up for the patient portal called "MyChart".  Sign up information is provided on this After Visit Summary.  MyChart is used to connect with patients for Virtual Visits (Telemedicine).  Patients are able to view lab/test results, encounter notes, upcoming appointments, etc.  Non-urgent messages can be sent to your provider as well.   To learn more about what you can do with MyChart, go to [email or web address].    Your next appointment:   As needed  The format for your next appointment:   In Person  Provider:   Lennie Odor, MD      Signed, Lenna Gilford. Flora Lipps, MD [facility]  [address] [place], [place] [address] [phone]  06/17/2020 4:10 PM

## 2020-06-17 ENCOUNTER — Other Ambulatory Visit: Payer: Self-pay

## 2020-06-17 ENCOUNTER — Ambulatory Visit: Payer: Managed Care, Other (non HMO) | Admitting: Cardiovascular Disease

## 2020-06-17 ENCOUNTER — Encounter: Payer: Self-pay | Admitting: Cardiovascular Disease

## 2020-06-17 VITALS — BP 108/66 | HR 71 | Ht 74.0 in | Wt 192.0 lb

## 2020-06-17 DIAGNOSIS — R002 Palpitations: Secondary | ICD-10-CM

## 2020-06-17 NOTE — Patient Instructions (Signed)
Medication Instructions:  The current medical regimen is effective;  continue present plan and medications.  *If you need a refill on your cardiac medications before your next appointment, please call your pharmacy*   Lab Work: BMET, TSH, CBC  If you have labs (blood work) drawn today and your tests are completely normal, you will receive your results only by: [place] Kitchen MyChart Message (if you have MyChart) OR . A paper copy in the mail If you have any lab test that is abnormal or we need to change your treatment, we will call you to review the results.   Follow-Up: At [facility], you and your health needs are our priority.  As part of our continuing mission to provide you with exceptional heart care, we have created designated Provider Care Teams.  These Care Teams include your primary Cardiologist (physician) and Advanced Practice Providers (APPs -  Physician Assistants and Nurse Practitioners) who all work together to provide you with the care you need, when you need it.  We recommend signing up for the patient portal called "MyChart".  Sign up information is provided on this After Visit Summary.  MyChart is used to connect with patients for Virtual Visits (Telemedicine).  Patients are able to view lab/test results, encounter notes, upcoming appointments, etc.  Non-urgent messages can be sent to your provider as well.   To learn more about what you can do with MyChart, go to [email or web address].    Your next appointment:   As needed  The format for your next appointment:   In Person  Provider:   Lennie Odor, MD

## 2020-06-18 LAB — BASIC METABOLIC PANEL
BUN/Creatinine Ratio: 12 (ref 9–20)
BUN: 13 mg/dL (ref 6–24)
CO2: 22 mmol/L (ref 20–29)
Calcium: 9.5 mg/dL (ref 8.7–10.2)
Chloride: 104 mmol/L (ref 96–106)
Creatinine, Ser: 1.11 mg/dL (ref 0.76–1.27)
GFR calc Af Amer: 96 mL/min/{1.73_m2} (ref >59)
GFR calc non Af Amer: 83 mL/min/{1.73_m2} (ref >59)
Glucose: 82 mg/dL (ref 65–99)
Potassium: 4.3 mmol/L (ref 3.5–5.2)
Sodium: 142 mmol/L (ref 134–144)

## 2020-06-18 LAB — TSH: TSH: 2.23 u[IU]/mL (ref 0.450–4.500)

## 2020-06-18 LAB — CBC
Hematocrit: 40.6 % (ref 37.5–51.0)
Hemoglobin: 13.6 g/dL (ref 13.0–17.7)
MCH: 29.4 pg (ref 26.6–33.0)
MCHC: 33.5 g/dL (ref 31.5–35.7)
MCV: 88 fL (ref 79–97)
Platelets: 332 10*3/uL (ref 150–450)
RBC: 4.62 x10E6/uL (ref 4.14–5.80)
RDW: 11.8 % (ref 11.6–15.4)
WBC: 6.9 10*3/uL (ref 3.4–10.8)

## 2021-01-06 IMAGING — DX DG ABDOMEN ACUTE W/ 1V CHEST
3 series · 3 of 3 positions shown · non-contrast
Comparison: None.

CLINICAL DATA: Pain.  Left flank pain.

EXAM:
ACUTE ABDOMEN SERIES (2 VIEW ABDOMEN AND 1 VIEW CHEST)

[chest pa]
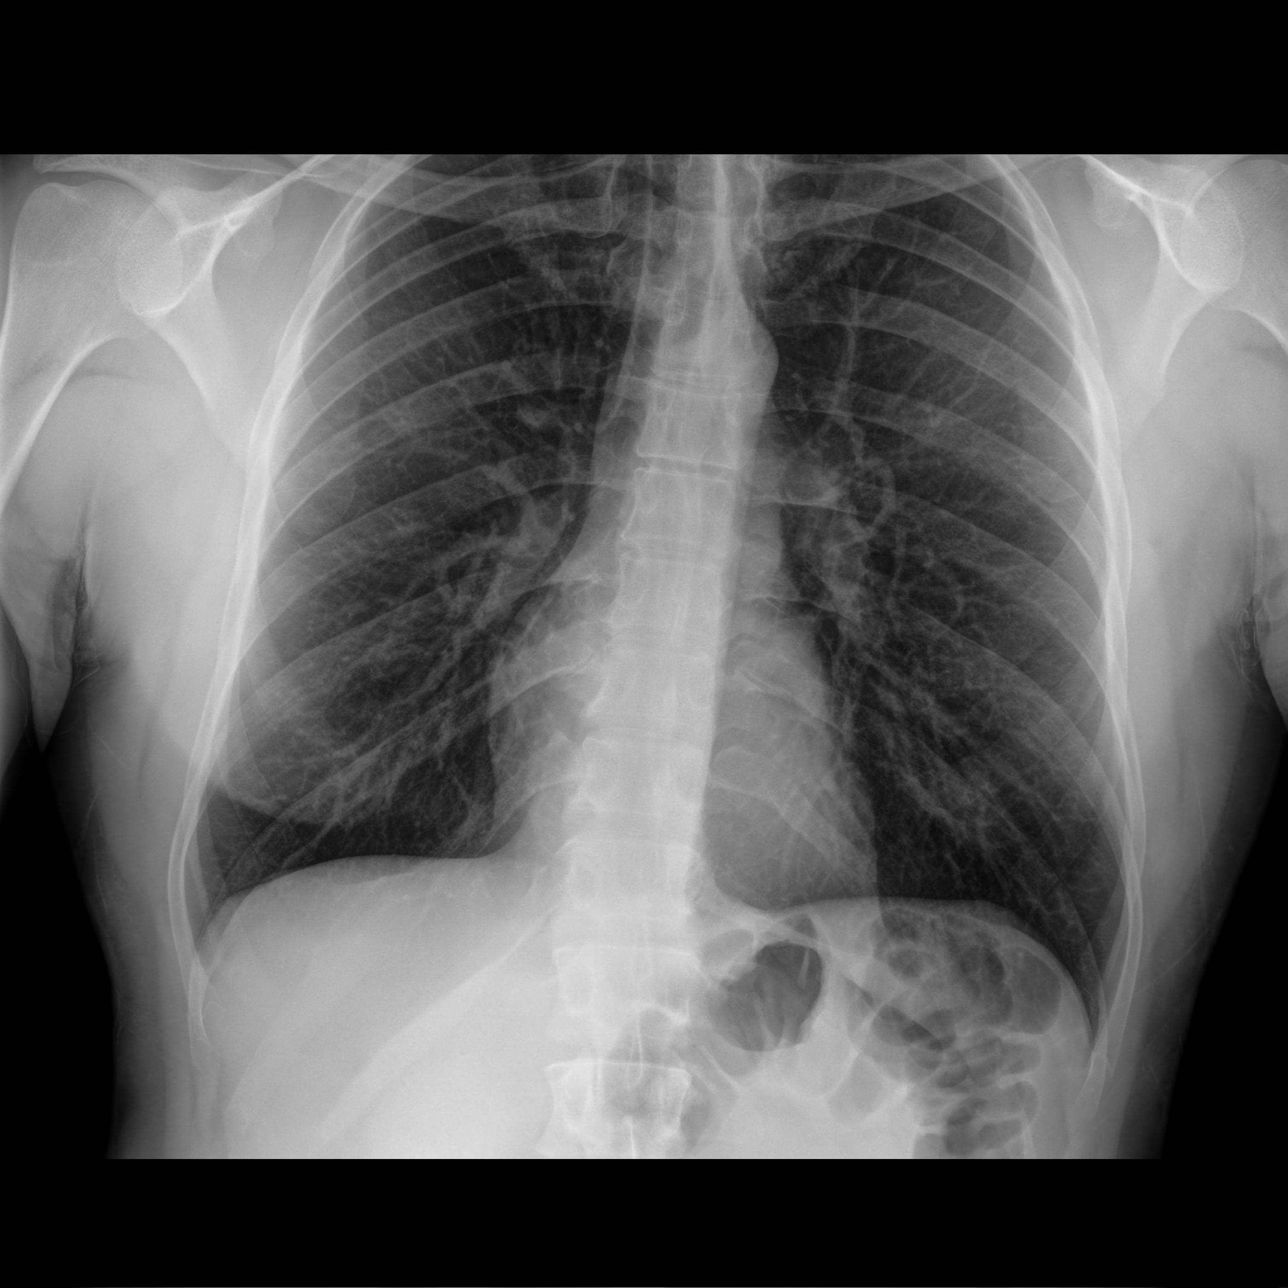

[abdomen standing ap]
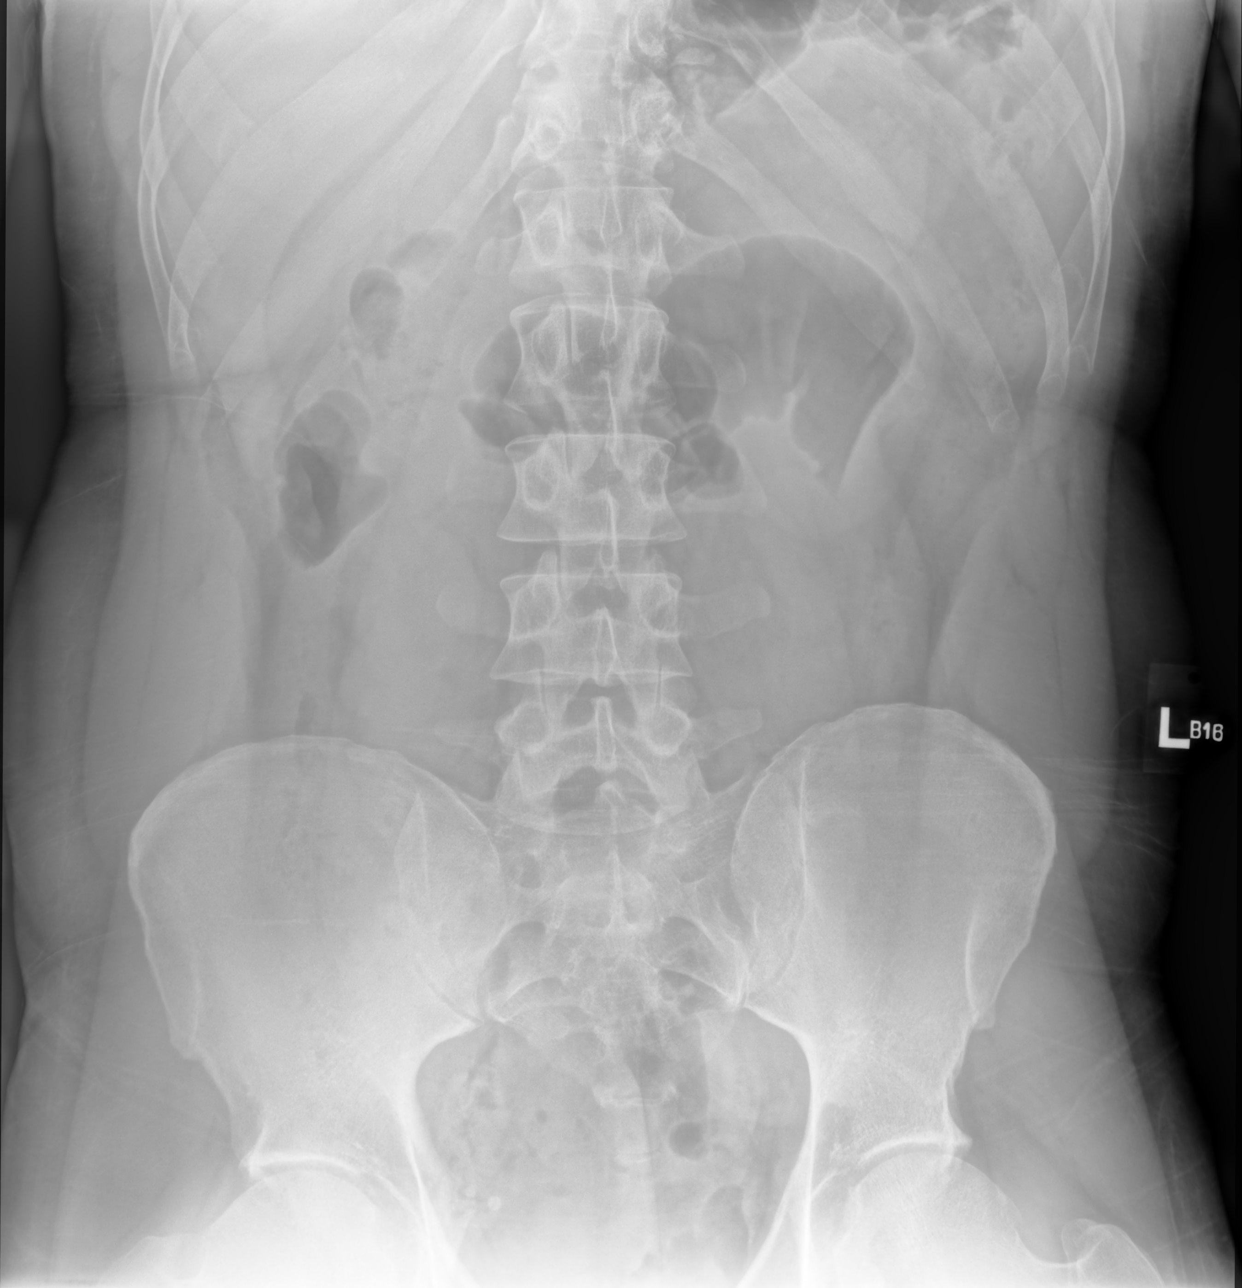

[abdomen supine ap]
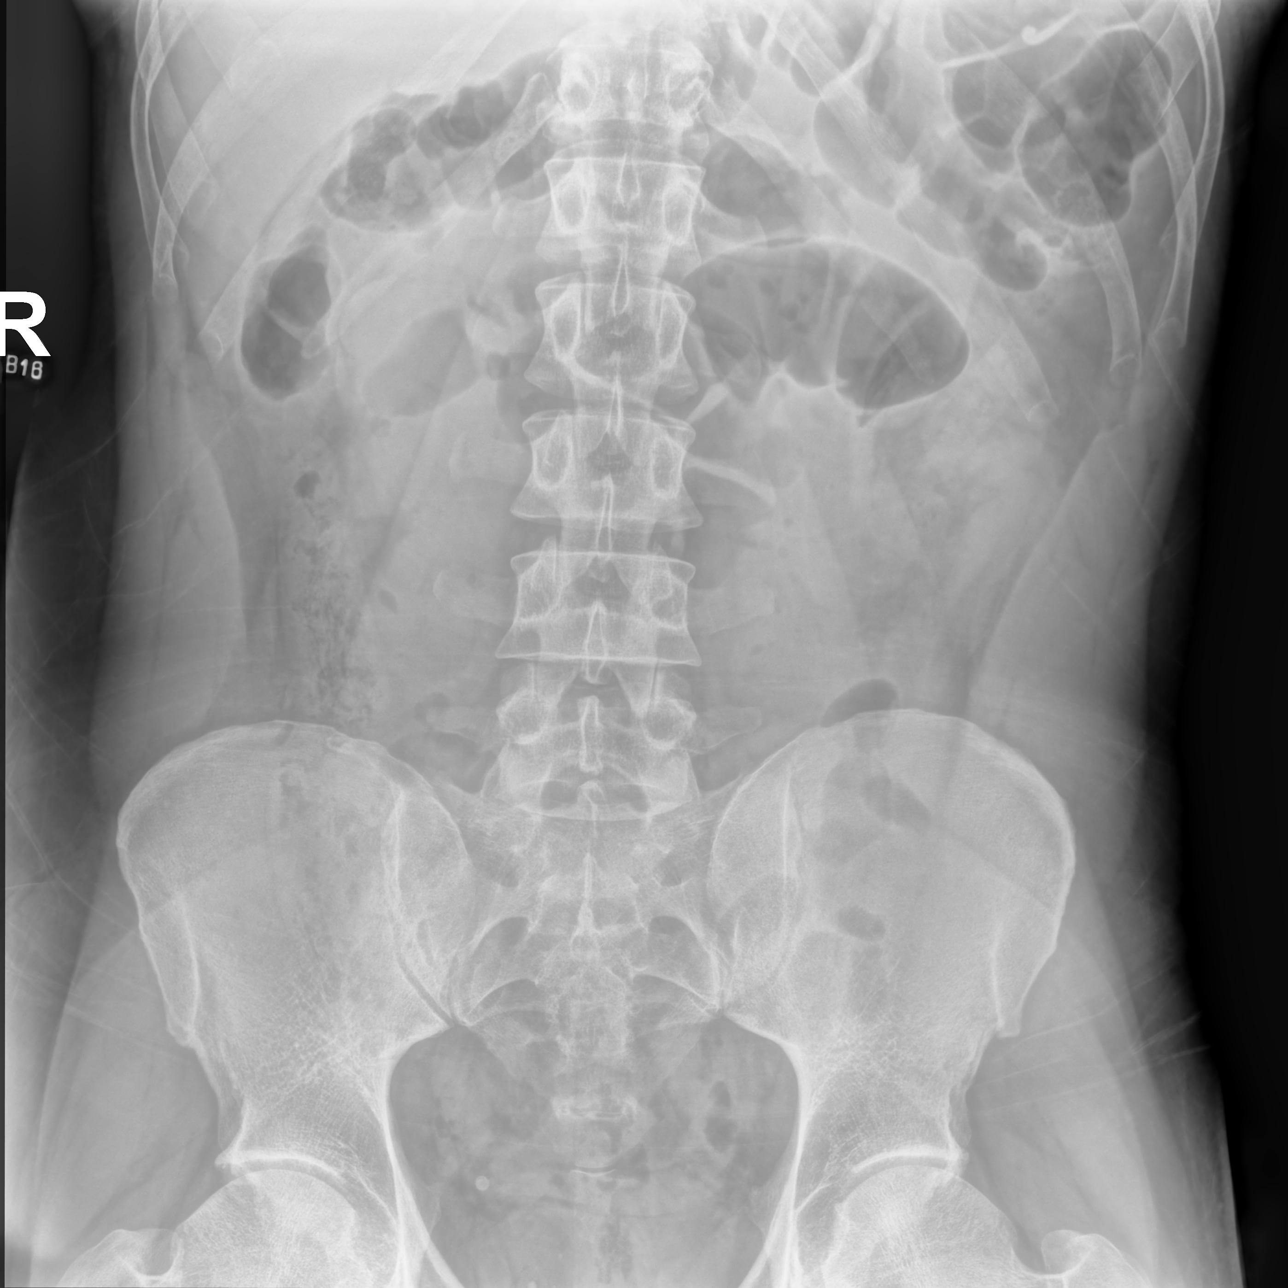

[3 of 3 positions shown; findings below may reference images not displayed]

FINDINGS: The lungs are clear. The bowel gas pattern is nonobstructive. There
are no definite radiopaque kidney stones. There is a calcification
in the patient's right hemipelvis favored to represent a phlebolith.
IMPRESSION: 1. No definite radiopaque kidney stones.
2. Nonobstructive bowel gas pattern.
3. No acute cardiopulmonary disease.

## 2021-11-03 ENCOUNTER — Other Ambulatory Visit: Payer: Self-pay

## 2021-11-03 ENCOUNTER — Emergency Department (HOSPITAL_COMMUNITY)
Admission: EM | Admit: 2021-11-03 | Discharge: 2021-11-03 | Disposition: A | Discharge status: Home / Self Care | Payer: Managed Care, Other (non HMO) | Attending: Emergency Medicine | Admitting: Emergency Medicine

## 2021-11-03 ENCOUNTER — Encounter (HOSPITAL_COMMUNITY): Payer: Self-pay | Admitting: Emergency Medicine

## 2021-11-03 DIAGNOSIS — J029 Acute pharyngitis, unspecified: Secondary | ICD-10-CM | POA: Insufficient documentation

## 2021-11-03 LAB — GROUP A STREP BY PCR: Group A Strep by PCR: NOT DETECTED

## 2021-11-03 MED ORDER — LIDOCAINE VISCOUS HCL 2 % MT SOLN: 15.0000 mL | OROMUCOSAL | 0 refills | Status: AC | PRN

## 2021-11-03 MED ORDER — LIDOCAINE VISCOUS HCL 2 % MT SOLN
15.0000 mL | Freq: Once | OROMUCOSAL | Status: AC
  Administered 2021-11-03: 15 mL via OROMUCOSAL
  Filled 2021-11-03: qty 15

## 2021-11-03 MED ORDER — DEXAMETHASONE 4 MG PO TABS
10.0000 mg | ORAL_TABLET | Freq: Once | ORAL | Status: AC
  Administered 2021-11-03: 10 mg via ORAL
  Filled 2021-11-03: qty 3

## 2021-11-03 NOTE — ED Notes (Signed)
See the doctors note he saw before myself

## 2021-11-03 NOTE — ED Triage Notes (Signed)
Patient reports sore throat for 2 weeks , denies SOB or fever .

## 2021-11-04 NOTE — ED Provider Notes (Signed)
  [facility] EMERGENCY DEPARTMENT Provider Note   CSN: [id number] Arrival date & time: 11/03/21  0423     History  Chief Complaint  Patient presents with   Sore Throat    Gerald Estrada is a 41 y.o. male.  41 yo M here with pharyngitis for about a week. Has been on left/right/middle intermittently day to day. No trouble breathign or swallowing. No fever. No PND. No rhinnorhea. No other associated symptoms.    Sore Throat       Home Medications Prior to Admission medications   Medication Sig Start Date End Date Taking? Authorizing Provider  lidocaine (XYLOCAINE) 2 % solution Use as directed 15 mLs in the mouth or throat as needed for mouth pain. 11/03/21  Yes Batya Citron, Barbara Cower, MD      Allergies    Patient has no known allergies.    Review of Systems   Review of Systems  Physical Exam Updated Vital Signs BP 127/78   Pulse 70   Temp 97.8 F (36.6 C)   Resp 16   SpO2 100%  Physical Exam Vitals and nursing note reviewed.  Constitutional:      Appearance: He is well-developed.  HENT:     Head: Normocephalic and atraumatic.     Nose: No congestion or rhinorrhea.     Mouth/Throat:     Pharynx: Posterior oropharyngeal erythema present.  Eyes:     Pupils: Pupils are equal, round, and reactive to light.  Cardiovascular:     Rate and Rhythm: Normal rate.  Pulmonary:     Effort: Pulmonary effort is normal. No respiratory distress.  Abdominal:     General: Abdomen is flat. There is no distension.  Musculoskeletal:        General: Normal range of motion.     Cervical back: Normal range of motion.  Skin:    General: Skin is warm and dry.  Neurological:     General: No focal deficit present.     Mental Status: He is alert.     ED Results / Procedures / Treatments   Labs (all labs ordered are listed, but only abnormal results are displayed) Labs Reviewed  GROUP A STREP BY PCR    EKG None  Radiology No results  found.  Procedures Procedures    Medications Ordered in ED Medications  dexamethasone (DECADRON) tablet 10 mg (10 mg Oral Given 11/03/21 0609)  lidocaine (XYLOCAINE) 2 % viscous mouth solution 15 mL (15 mLs Mouth/Throat Given 11/03/21 0609)    ED Course/ Medical Decision Making/ A&P                           Medical Decision Making Risk Prescription drug management.   Pharyngitis withotu clear etiology. No e/o RPA or PTA. Steroids/lidocaine seemed to help. No indication for antibiotics. Expectant management explained.   Final Clinical Impression(s) / ED Diagnoses Final diagnoses:  Pharyngitis, unspecified etiology    Rx / DC Orders ED Discharge Orders          Ordered    lidocaine (XYLOCAINE) 2 % solution  As needed        11/03/21 0611              Sierah Lacewell, Barbara Cower, MD 11/04/21 [phone]

## 2021-11-06 ENCOUNTER — Emergency Department (HOSPITAL_BASED_OUTPATIENT_CLINIC_OR_DEPARTMENT_OTHER)
Admission: EM | Admit: 2021-11-06 | Discharge: 2021-11-06 | Disposition: A | Discharge status: Home / Self Care | Payer: Self-pay | Attending: Emergency Medicine | Admitting: Emergency Medicine

## 2021-11-06 ENCOUNTER — Encounter (HOSPITAL_BASED_OUTPATIENT_CLINIC_OR_DEPARTMENT_OTHER): Payer: Self-pay | Admitting: *Deleted

## 2021-11-06 ENCOUNTER — Other Ambulatory Visit: Payer: Self-pay

## 2021-11-06 DIAGNOSIS — R059 Cough, unspecified: Secondary | ICD-10-CM | POA: Insufficient documentation

## 2021-11-06 DIAGNOSIS — B9789 Other viral agents as the cause of diseases classified elsewhere: Secondary | ICD-10-CM | POA: Insufficient documentation

## 2021-11-06 DIAGNOSIS — J028 Acute pharyngitis due to other specified organisms: Secondary | ICD-10-CM | POA: Insufficient documentation

## 2021-11-06 MED ORDER — BENZONATATE 100 MG PO CAPS: 100.0000 mg | ORAL_CAPSULE | Freq: Three times a day (TID) | ORAL | 0 refills | Status: AC | PRN

## 2021-11-06 MED ORDER — BENZONATATE 100 MG PO CAPS
100.0000 mg | ORAL_CAPSULE | Freq: Once | ORAL | Status: AC
  Administered 2021-11-06: 100 mg via ORAL
  Filled 2021-11-06 (×2): qty 1

## 2021-11-06 MED ORDER — AZITHROMYCIN 250 MG PO TABS: ORAL_TABLET | ORAL | 0 refills | Status: AC

## 2021-11-06 NOTE — ED Provider Notes (Signed)
[facility] EMERGENCY DEPT Provider Note  CSN: [id number] Arrival date & time: 11/06/21 0453  Chief Complaint(s) Sore Throat  HPI Gerald Estrada is a 41 y.o. male who presents to the emergency department with worsening sore throat.  Patient was seen in the on 6/29 for the same and had negative rapid strep.  He was discharged after getting steroids and viscous lidocaine.  Reports that the viscous lidocaine is no longer helping.  Pain is now radiating to the left ear.  He reports that the pain initially began around June 15.  Pain is worse with swallowing but patient is still hydrating and eating well.  No emesis.  No shortness of breath.  Intermittent cough as well.  No nasal congestion.  The history is provided by the patient.    Past Medical History History reviewed. No pertinent past medical history. There are no problems to display for this patient.  Home Medication(s) Prior to Admission medications   Medication Sig Start Date End Date Taking? Authorizing Provider  azithromycin (ZITHROMAX Z-PAK) 250 MG tablet Take 500 mg on day 1, then 250 mg once a day for days 2, 3, 4, and 5. 11/06/21  Yes Mcihael Hinderman, Amadeo Garnet, MD  lidocaine (XYLOCAINE) 2 % solution Use as directed 15 mLs in the mouth or throat as needed for mouth pain. 11/03/21   Mesner, Barbara Cower, MD                                                                                                                                    Allergies Patient has no known allergies.  Review of Systems Review of Systems As noted in HPI  Physical Exam Vital Signs  I have reviewed the triage vital signs BP 127/78 (BP Location: Right Arm)   Pulse 80   Temp 98.5 F (36.9 C)   Resp 18   Ht 6\' 2"  (1.88 m)   Wt 86.2 kg   SpO2 99%   BMI 24.39 kg/m   Physical Exam Vitals reviewed.  Constitutional:      General: He is not in acute distress.    Appearance: He is well-developed. He is not diaphoretic.  HENT:     Head:  Normocephalic and atraumatic.     Right Ear: External ear normal.     Left Ear: External ear normal.     Nose: Nose normal.     Mouth/Throat:     Mouth: Mucous membranes are moist. No oral lesions or angioedema.     Palate: No lesions.     Pharynx: Posterior oropharyngeal erythema present. No pharyngeal swelling, oropharyngeal exudate or uvula swelling.     Tonsils: No tonsillar exudate or tonsillar abscesses.  Eyes:     General: No scleral icterus.    Conjunctiva/sclera: Conjunctivae normal.  Neck:     Trachea: Phonation normal.  Cardiovascular:     Rate and Rhythm: Normal rate and regular rhythm.  Pulmonary:  Effort: Pulmonary effort is normal. No respiratory distress.     Breath sounds: No stridor.  Abdominal:     General: There is no distension.  Musculoskeletal:        General: Normal range of motion.     Cervical back: Normal range of motion.  Neurological:     Mental Status: He is alert and oriented to person, place, and time.  Psychiatric:        Behavior: Behavior normal.     ED Results and Treatments Labs (all labs ordered are listed, but only abnormal results are displayed) Labs Reviewed - No data to display                                                                                                                       EKG  EKG Interpretation  Date/Time:    Ventricular Rate:    PR Interval:    QRS Duration:   QT Interval:    QTC Calculation:   R Axis:     Text Interpretation:         Radiology No results found.  Pertinent labs & imaging results that were available during my care of the patient were reviewed by me and considered in my medical decision making (see MDM for details).  Medications Ordered in ED Medications  benzonatate (TESSALON) capsule 100 mg (100 mg Oral Given 11/06/21 0543)                                                                                                                                      Procedures Procedures  (including critical care time)  Medical Decision Making / ED Course    Complexity of Problem:  Patient's presenting problem/concern, DDX, and MDM listed below: Sore throat Exam is not consistent with PTA or RPA. Presentation is not suspicious for tonsillitis or supraglottitis. Likely viral versus atypical bacterial pharyngitis No need for imaging or labs at this time.     ED Course:    Assessment, Add'l Intervention, and Reassessment: Pharyngitis Provided with Tessalon Perle We will treat for possible atypical bacterial pharyngitis    Final Clinical Impression(s) / ED Diagnoses Final diagnoses:  Acute pharyngitis due to other specified organisms   The patient appears reasonably screened and/or stabilized for discharge and I doubt any other medical condition or other [facility] requiring further screening, evaluation, or treatment in the ED at this time  prior to discharge. Safe for discharge with strict return precautions.  Disposition: Discharge  Condition: Good  I have discussed the results, Dx and Tx plan with the patient/family who expressed understanding and agree(s) with the plan. Discharge instructions discussed at length. The patient/family was given strict return precautions who verbalized understanding of the instructions. No further questions at time of discharge.    ED Discharge Orders          Ordered    azithromycin (ZITHROMAX Z-PAK) 250 MG tablet        11/06/21 0530            Follow Up: Morrell Riddle, PA-C [address] [place] [address] [phone]  Call  to schedule an appointment for close follow up           This chart was dictated using voice recognition software.  Despite best efforts to proofread,  errors can occur which can change the documentation meaning.    Nira Conn, MD 11/06/21 [phone]

## 2021-11-06 NOTE — ED Notes (Signed)
Pt agreeable with d/c plan as discussed by provider -this nurse has verbally reinforced d/c instructions and provided pt with written copy- pt acknowledges verbal understanding and denies any additional questions, concerns, needs- ambulatory at d/c with steady gait; no distress; vitals stable- escorted by spouse

## 2021-11-06 NOTE — ED Triage Notes (Signed)
Pt c/o of sore throat since June 15th. Was seen at the [facility] ED on 6-29. States he feels like the pain is radiating into his left ear. No fevers. Has been taking excedrin for pain. States she has felt dizzy at times. C/o pain to left neck area around lymph node. Throat is mildly red on exam.
